# Patient Record
Sex: Male | Born: 1980 | Race: Black or African American | Hispanic: No | Marital: Married | Smoking: Never smoker
Health system: Southern US, Community
[De-identification: ages and names within clinical notes are randomized; demographics above are authoritative.]

## PROBLEM LIST (undated history)

## (undated) HISTORY — PX: NO PAST SURGERIES: SHX2092

---

## 2015-03-19 ENCOUNTER — Emergency Department (INDEPENDENT_AMBULATORY_CARE_PROVIDER_SITE_OTHER)
Admission: EM | Admit: 2015-03-19 | Discharge: 2015-03-19 | Disposition: A | Discharge status: Home / Self Care | Payer: BLUE CROSS/BLUE SHIELD | Source: Home / Self Care | Attending: Family Medicine | Admitting: Family Medicine

## 2015-03-19 ENCOUNTER — Encounter (HOSPITAL_COMMUNITY): Payer: Self-pay | Admitting: Emergency Medicine

## 2015-03-19 ENCOUNTER — Emergency Department (INDEPENDENT_AMBULATORY_CARE_PROVIDER_SITE_OTHER): Payer: BLUE CROSS/BLUE SHIELD

## 2015-03-19 DIAGNOSIS — J189 Pneumonia, unspecified organism: Secondary | ICD-10-CM | POA: Diagnosis not present

## 2015-03-19 MED ORDER — CEFDINIR 300 MG PO CAPS: 300.0000 mg | ORAL_CAPSULE | Freq: Two times a day (BID) | ORAL | Status: DC

## 2015-03-19 MED ORDER — AZITHROMYCIN 250 MG PO TABS: 250.0000 mg | ORAL_TABLET | Freq: Every day | ORAL | Status: DC

## 2015-03-19 NOTE — Discharge Instructions (Signed)
Thank you for coming in today. °Call or go to the emergency room if you get worse, have trouble breathing, have chest pains, or palpitations.  ° °Pneumonia °Pneumonia is an infection of the lungs.  °CAUSES °Pneumonia may be caused by bacteria or a virus. Usually, these infections are caused by breathing infectious particles into the lungs (respiratory tract). °SIGNS AND SYMPTOMS  °· Cough. °· Fever. °· Chest pain. °· Increased rate of breathing. °· Wheezing. °· Mucus production. °DIAGNOSIS  °If you have the common symptoms of pneumonia, your health care provider will typically confirm the diagnosis with a chest X-ray. The X-ray will show an abnormality in the lung (pulmonary infiltrate) if you have pneumonia. Other tests of your blood, urine, or sputum may be done to find the specific cause of your pneumonia. Your health care provider may also do tests (blood gases or pulse oximetry) to see how well your lungs are working. °TREATMENT  °Some forms of pneumonia may be spread to other people when you cough or sneeze. You may be asked to wear a mask before and during your exam. Pneumonia that is caused by bacteria is treated with antibiotic medicine. Pneumonia that is caused by the influenza virus may be treated with an antiviral medicine. Most other viral infections must run their course. These infections will not respond to antibiotics.  °HOME CARE INSTRUCTIONS  °· Cough suppressants may be used if you are losing too much rest. However, coughing protects you by clearing your lungs. You should avoid using cough suppressants if you can. °· Your health care provider may have prescribed medicine if he or she thinks your pneumonia is caused by bacteria or influenza. Finish your medicine even if you start to feel better. °· Your health care provider may also prescribe an expectorant. This loosens the mucus to be coughed up. °· Take medicines only as directed by your health care provider. °· Do not smoke. Smoking is a common  cause of bronchitis and can contribute to pneumonia. If you are a smoker and continue to smoke, your cough may last several weeks after your pneumonia has cleared. °· A cold steam vaporizer or humidifier in your room or home may help loosen mucus. °· Coughing is often worse at night. Sleeping in a semi-upright position in a recliner or using a couple pillows under your head will help with this. °· Get rest as you feel it is needed. Your body will usually let you know when you need to rest. °PREVENTION °A pneumococcal shot (vaccine) is available to prevent a common bacterial cause of pneumonia. This is usually suggested for: °· People over 65 years old. °· Patients on chemotherapy. °· People with chronic lung problems, such as bronchitis or emphysema. °· People with immune system problems. °If you are over 65 or have a high risk condition, you may receive the pneumococcal vaccine if you have not received it before. In some countries, a routine influenza vaccine is also recommended. This vaccine can help prevent some cases of pneumonia. You may be offered the influenza vaccine as part of your care. °If you smoke, it is time to quit. You may receive instructions on how to stop smoking. Your health care provider can provide medicines and counseling to help you quit. °SEEK MEDICAL CARE IF: °You have a fever. °SEEK IMMEDIATE MEDICAL CARE IF:  °· Your illness becomes worse. This is especially true if you are elderly or weakened from any other disease. °· You cannot control your cough with suppressants and   are losing sleep. °· You begin coughing up blood. °· You develop pain which is getting worse or is uncontrolled with medicines. °· Any of the symptoms which initially brought you in for treatment are getting worse rather than better. °· You develop shortness of breath or chest pain. °MAKE SURE YOU:  °· Understand these instructions. °· Will watch your condition. °· Will get help right away if you are not doing well or get  worse. °Document Released: 12/04/2005 Document Revised: 04/20/2014 Document Reviewed: 02/23/2011 °Exit[employer]® Patient Information ©2015 [employer]. This information is not intended to replace advice given to you by your health care provider. Make sure you discuss any questions you have with your health care provider. ° °

## 2015-03-19 NOTE — ED Provider Notes (Signed)
Cam Haibraham Teeweh Honse III is a 34 y.o. male who presents to Urgent Care today for left lateral chest pain with deep inspiration. Symptoms present for one week. Patient notes bodyaches fevers and fatigue. Pain is located in the lateral aspect of his chest wall and is up to his shoulder blade. No pain with exertion. No vomiting or diarrhea.   History reviewed. No pertinent past medical history. History reviewed. No pertinent past surgical history. History  Substance Use Topics  . Smoking status: Never Smoker   . Smokeless tobacco: Not on file  . Alcohol Use: No   ROS as above Medications: No current facility-administered medications for this encounter.   Current Outpatient Prescriptions  Medication Sig Dispense Refill  . azithromycin (ZITHROMAX) 250 MG tablet Take 1 tablet (250 mg total) by mouth daily. Take first 2 tablets together, then 1 every day until finished. 6 tablet 0  . cefdinir (OMNICEF) 300 MG capsule Take 1 capsule (300 mg total) by mouth 2 (two) times daily. 14 capsule 0   No Known Allergies   Exam:  BP 141/87 mmHg  Pulse 118  Temp(Src) 102.7 F (39.3 C) (Oral)  Resp 18  SpO2 95% Gen: Well NAD nontoxic appearing HEENT: EOMI,  MMM Lungs: Normal work of breathing. Coarse breath sounds lateral left lung Chest wall: Nontender Heart: Tachycardia but regular no MRG Abd: NABS, Soft. Nondistended, Nontender Exts: Brisk capillary refill, warm and well perfused.   No results found for this or any previous visit (from the past 24 hour(s)). Dg Chest 2 View  03/19/2015   CLINICAL DATA:  Chest pain x1 week  EXAM: CHEST  2 VIEW  COMPARISON:  None.  FINDINGS: Left basilar opacity, atelectasis versus pneumonia.  Right lung is clear.  No pleural effusion or pneumothorax.  The heart is normal in size.  Visualized osseous structures are within normal limits.  IMPRESSION: Left basilar opacity, atelectasis versus pneumonia.   Electronically Signed   By: Charline BillsSriyesh  Krishnan M.D.   On:  03/19/2015 20:38    Assessment and Plan: 34 y.o. male with community-acquired pneumonia. Patient is well appearing. Treat with Omnicef and azithromycin. Prompt follow-up of worsening. Return as needed.  Discussed warning signs or symptoms. Please see discharge instructions. Patient expresses understanding.     Rodolph BongEvan S Duayne Brideau, MD 03/19/15 2120

## 2015-03-19 NOTE — ED Notes (Signed)
C/o cold sx onset 1 week Sx include body aches, fevers, fatigue, back pain when he breathes  Also reports constipation x1 week Last had aleve today around 0600 Alert, no signs of acute distress.

## 2015-04-01 ENCOUNTER — Ambulatory Visit
Admission: RE | Admit: 2015-04-01 | Discharge: 2015-04-01 | Disposition: A | Discharge status: Home / Self Care | Payer: BLUE CROSS/BLUE SHIELD | Source: Ambulatory Visit | Attending: Family Medicine | Admitting: Family Medicine

## 2015-04-01 ENCOUNTER — Other Ambulatory Visit: Payer: Self-pay | Admitting: Family Medicine

## 2015-04-01 DIAGNOSIS — J189 Pneumonia, unspecified organism: Secondary | ICD-10-CM

## 2015-04-23 ENCOUNTER — Ambulatory Visit
Admission: RE | Admit: 2015-04-23 | Discharge: 2015-04-23 | Disposition: A | Discharge status: Home / Self Care | Payer: BLUE CROSS/BLUE SHIELD | Source: Ambulatory Visit | Attending: Family Medicine | Admitting: Family Medicine

## 2015-04-23 ENCOUNTER — Other Ambulatory Visit: Payer: Self-pay | Admitting: Family Medicine

## 2015-04-23 DIAGNOSIS — R0602 Shortness of breath: Secondary | ICD-10-CM

## 2015-04-28 ENCOUNTER — Other Ambulatory Visit: Payer: Self-pay | Admitting: Family Medicine

## 2015-04-28 DIAGNOSIS — R918 Other nonspecific abnormal finding of lung field: Secondary | ICD-10-CM

## 2015-04-30 ENCOUNTER — Ambulatory Visit
Admission: RE | Admit: 2015-04-30 | Discharge: 2015-04-30 | Disposition: A | Discharge status: Home / Self Care | Payer: BLUE CROSS/BLUE SHIELD | Source: Ambulatory Visit | Attending: Family Medicine | Admitting: Family Medicine

## 2015-04-30 DIAGNOSIS — R918 Other nonspecific abnormal finding of lung field: Secondary | ICD-10-CM

## 2015-05-11 ENCOUNTER — Other Ambulatory Visit: Payer: Self-pay | Admitting: Family Medicine

## 2015-05-11 DIAGNOSIS — R591 Generalized enlarged lymph nodes: Secondary | ICD-10-CM

## 2015-05-14 ENCOUNTER — Ambulatory Visit
Admission: RE | Admit: 2015-05-14 | Discharge: 2015-05-14 | Disposition: A | Discharge status: Home / Self Care | Payer: BLUE CROSS/BLUE SHIELD | Source: Ambulatory Visit | Attending: Family Medicine | Admitting: Family Medicine

## 2015-05-14 DIAGNOSIS — R591 Generalized enlarged lymph nodes: Secondary | ICD-10-CM

## 2015-05-14 MED ORDER — IOPAMIDOL (ISOVUE-300) INJECTION 61%
125.0000 mL | Freq: Once | INTRAVENOUS | Status: AC | PRN
  Administered 2015-05-14: 125 mL via INTRAVENOUS

## 2015-05-20 ENCOUNTER — Other Ambulatory Visit (HOSPITAL_COMMUNITY): Payer: Self-pay | Admitting: Family Medicine

## 2015-05-20 ENCOUNTER — Ambulatory Visit (HOSPITAL_COMMUNITY)
Admission: RE | Admit: 2015-05-20 | Discharge: 2015-05-20 | Disposition: A | Discharge status: Home / Self Care | Payer: BLUE CROSS/BLUE SHIELD | Source: Ambulatory Visit | Attending: Cardiovascular Disease | Admitting: Cardiovascular Disease

## 2015-05-20 ENCOUNTER — Other Ambulatory Visit: Payer: Self-pay | Admitting: Family Medicine

## 2015-05-20 DIAGNOSIS — I340 Nonrheumatic mitral (valve) insufficiency: Secondary | ICD-10-CM | POA: Diagnosis not present

## 2015-05-20 DIAGNOSIS — R0602 Shortness of breath: Secondary | ICD-10-CM

## 2015-05-20 DIAGNOSIS — R0600 Dyspnea, unspecified: Secondary | ICD-10-CM

## 2015-05-20 DIAGNOSIS — I517 Cardiomegaly: Secondary | ICD-10-CM

## 2015-05-20 DIAGNOSIS — R0609 Other forms of dyspnea: Secondary | ICD-10-CM

## 2015-05-21 ENCOUNTER — Ambulatory Visit (INDEPENDENT_AMBULATORY_CARE_PROVIDER_SITE_OTHER): Payer: BLUE CROSS/BLUE SHIELD | Admitting: Internal Medicine

## 2015-05-21 ENCOUNTER — Encounter (INDEPENDENT_AMBULATORY_CARE_PROVIDER_SITE_OTHER): Payer: Self-pay

## 2015-05-21 DIAGNOSIS — R0600 Dyspnea, unspecified: Secondary | ICD-10-CM

## 2015-05-21 LAB — PULMONARY FUNCTION TEST
DL/VA % pred: 98 %
DL/VA: 4.84 ml/min/mmHg/L
DLCO UNC: 17.91 ml/min/mmHg
DLCO unc % pred: 47 %
FEF 25-75 Post: 1.91 L/sec
FEF 25-75 Pre: 1.45 L/sec
FEF2575-%Change-Post: 31 %
FEF2575-%PRED-POST: 44 %
FEF2575-%PRED-PRE: 33 %
FEV1-%Change-Post: 10 %
FEV1-%PRED-POST: 45 %
FEV1-%PRED-PRE: 41 %
FEV1-Post: 1.91 L
FEV1-Pre: 1.73 L
FEV1FVC-%CHANGE-POST: 3 %
FEV1FVC-%Pred-Pre: 92 %
FEV6-%Change-Post: 8 %
FEV6-%Pred-Post: 48 %
FEV6-%Pred-Pre: 44 %
FEV6-Post: 2.41 L
FEV6-Pre: 2.22 L
FEV6FVC-%PRED-POST: 102 %
FEV6FVC-%Pred-Pre: 102 %
FVC-%Change-Post: 7 %
FVC-%PRED-POST: 47 %
FVC-%Pred-Pre: 44 %
FVC-POST: 2.41 L
FVC-Pre: 2.24 L
PRE FEV1/FVC RATIO: 77 %
Post FEV1/FVC ratio: 79 %
Post FEV6/FVC ratio: 100 %
Pre FEV6/FVC Ratio: 100 %
RV % pred: 70 %
RV: 1.38 L
TLC % pred: 48 %
TLC: 3.73 L

## 2015-05-21 NOTE — Progress Notes (Signed)
PFT done today. 

## 2015-05-25 ENCOUNTER — Institutional Professional Consult (permissible substitution): Payer: BLUE CROSS/BLUE SHIELD | Admitting: Emergency Medicine

## 2015-06-02 ENCOUNTER — Ambulatory Visit (INDEPENDENT_AMBULATORY_CARE_PROVIDER_SITE_OTHER): Payer: Self-pay | Admitting: Pulmonary Disease

## 2015-06-02 ENCOUNTER — Encounter: Payer: Self-pay | Admitting: Pulmonary Disease

## 2015-06-02 VITALS — BP 124/92 | HR 84 | Ht 74.0 in | Wt 253.0 lb

## 2015-06-02 DIAGNOSIS — D869 Sarcoidosis, unspecified: Secondary | ICD-10-CM

## 2015-06-02 NOTE — Patient Instructions (Signed)
Will get medical records from Dr. Ines Bloomer office Follow up in 4 to 6 weeks

## 2015-06-02 NOTE — Progress Notes (Deleted)
   Subjective:    Patient ID: David Bowen, male    DOB: 26-Nov-1981, 34 y.o.   MRN: [id number]  HPI    Review of Systems  Constitutional: Negative for fever and unexpected weight change.  HENT: Positive for congestion. Negative for dental problem, ear pain, nosebleeds, postnasal drip, rhinorrhea, sinus pressure, sneezing, sore throat and trouble swallowing.   Eyes: Positive for pain. Negative for redness and itching.  Respiratory: Positive for shortness of breath. Negative for cough, chest tightness and wheezing.   Cardiovascular: Positive for chest pain ( sharp at times//pressure/restricted). Negative for palpitations and leg swelling.  Gastrointestinal: Negative for nausea and vomiting.  Genitourinary: Negative for dysuria.  Musculoskeletal: Positive for joint swelling and arthralgias.  Skin: Negative for rash.  Neurological: Negative for headaches.  Hematological: Does not bruise/bleed easily.  Psychiatric/Behavioral: Negative for dysphoric mood. The patient is not nervous/anxious.        Objective:   Physical Exam        Assessment & Plan:

## 2015-06-02 NOTE — Progress Notes (Signed)
Chief Complaint  Patient presents with  . PULMONARY CONSULT    Referred by Dr Shirlean Mylar - Pulm HTN and Sarcoid. Recent CT and PFT.     History of Present Illness: David Bowen is a 34 y.o. male for evaluation of sarcoidosis.  He was doing well until April.  He was told he had pneumonia at urgent care, and treated with antibiotics.  This didn't help.  He then was seen by his PCP and given different antibiotic.  He felt better, but still had dyspnea and chest pain.  As a result he had CT chest and then abdomen/pelvis.  He was referred to Dr. Kellie Simmering with rheumatology.  He says he had lab work, and was told he had sarcoidosis.  He was started on prednisone earlier this week.  He is feeling much better.  His CT chest/abd/pelvis showed lymphadenopathy.  He has not had any biopsy of these yet.  His PFT showed restrictive and diffusion defect.  Echo showed mild elevation in pulmonary artery pressures.  He denies cough, sputum, fever, rash, or swelling.  He lost about 25 lbs in April.  His weight has been steady since then.  He denies hx of smoking.  There is no travel hx.  He has a Development worker, international aid.  He works as a Veterinary surgeon.  He is from [place], but has lived in [place] for the past 11 years.  He was never in the [employer].   Tests: CT chest 04/30/15 >> LAN CT abd/pelvis 05/14/15 >> lower thoracic and pelvic LAN Echo 05/20/15 >> mild LVH, EF 55 to 60%, grade 1 diastolic dysfx, mild MR, PAS 32 mmHg PFT 05/21/15 >> FEV1 1.91 (45%), FEV1% 79, TLC 3.73 (48%), DLCO 47%, no BD  David Bowen  has no past medical history on file.  David Bowen  has past surgical history that includes No past surgeries.  Prior to Admission medications   Medication Sig Start Date End Date Taking? Authorizing Provider  Calcium Carb-Cholecalciferol (CALCIUM-VITAMIN D) 600-400 MG-UNIT TABS Take 1 capsule by mouth daily.   Yes Historical Provider, MD  predniSONE (DELTASONE) 20 MG tablet  Take 60 mg by mouth daily with breakfast.   Yes Historical Provider, MD    No Known Allergies  His family history includes Allergies in his mother and sister; Cancer in his sister.  He  reports that he has never smoked. He does not have any smokeless tobacco history on file. He reports that he does not drink alcohol or use illicit drugs.  Review of Systems  Constitutional: Negative for fever and unexpected weight change.  HENT: Positive for congestion. Negative for dental problem, ear pain, nosebleeds, postnasal drip, rhinorrhea, sinus pressure, sneezing, sore throat and trouble swallowing.   Eyes: Positive for pain. Negative for redness and itching.  Respiratory: Positive for shortness of breath. Negative for cough, chest tightness and wheezing.   Cardiovascular: Positive for chest pain ( sharp at times//pressure/restricted). Negative for palpitations and leg swelling.  Gastrointestinal: Negative for nausea and vomiting.  Genitourinary: Negative for dysuria.  Musculoskeletal: Positive for joint swelling and arthralgias.  Skin: Negative for rash.  Neurological: Negative for headaches.  Hematological: Does not bruise/bleed easily.  Psychiatric/Behavioral: Negative for dysphoric mood. The patient is not nervous/anxious.    Physical Exam: Blood pressure 124/92, pulse 84, height 6\' 2"  (1.88 m), weight 253 lb (114.76 kg), SpO2 98 %. Body mass index is 32.47 kg/(m^2).  General - No distress ENT - No sinus tenderness,  no oral exudate, no LAN, no thyromegaly, TM clear, pupils equal/reactive Cardiac - s1s2 regular, no murmur, pulses symmetric Chest - No wheeze/rales/dullness, good air entry, normal respiratory excursion Back - No focal tenderness Abd - Soft, non-tender, no organomegaly, + bowel sounds Ext - No edema Neuro - Normal strength, cranial nerves intact Skin - No rashes Psych - Normal mood, and behavior  Discussion: He had acute respiratory illness in April.  His CT imaging  showed lymphadenopathy.  His PFT showed restrictive and diffusion defects.  He had recent evaluation by rheumatology, and was felt to have sarcoidosis.  He reports symptomatic improvement since being started on prednisone.  He has not had tissue confirmation of sarcoidosis.  Assessment/Plan:  Probable sarcoidosis. Plan: - continue prednisone as per rheumatology - will get copy of lab tests from rheumatology - will need f/u imaging studies in few weeks >> if no improvement, then will need tissue sampling   Coralyn Helling, MD [facility] Pager:  [phone]

## 2015-06-10 ENCOUNTER — Telehealth: Payer: Self-pay | Admitting: Pulmonary Disease

## 2015-06-10 NOTE — Telephone Encounter (Signed)
Received records from Dr. Kellie Simmering forwarded to Dr. Craige Cotta 06/10/15 fbg.

## 2015-06-24 ENCOUNTER — Encounter: Payer: Self-pay | Admitting: Pulmonary Disease

## 2015-06-24 DIAGNOSIS — J849 Interstitial pulmonary disease, unspecified: Secondary | ICD-10-CM | POA: Insufficient documentation

## 2015-07-18 IMAGING — CT CT CHEST W/O CM
3 of 4 series · 17 of 30 positions shown, 19 images · non-contrast
Comparison: Chest x-ray 04/23/2015 and 03/19/2015

CLINICAL DATA: Followup pneumonia and infiltrates. 25 lb weight
loss since [place]. No history of cancer.

EXAM:
CT CHEST WITHOUT CONTRAST
TECHNIQUE: Multidetector CT imaging of the chest was performed following the
standard protocol without IV contrast.

[Series 3: chest w/o · axial · non-contrast · 0.70mm/px · z∈[-214,-14]mm · 5 of 61 slices shown, 7 images]
[im 11/61  mediastinal]
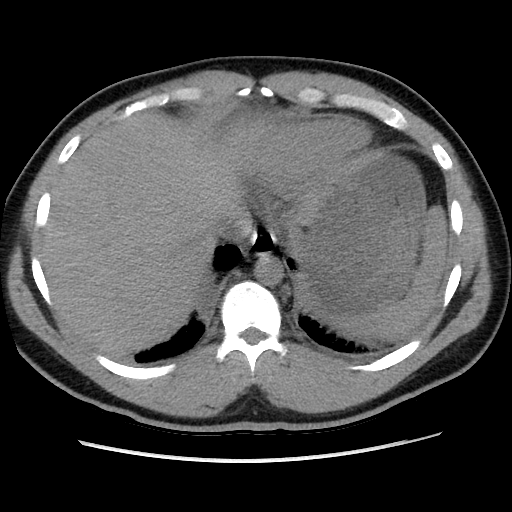
[im 11/61  lung]
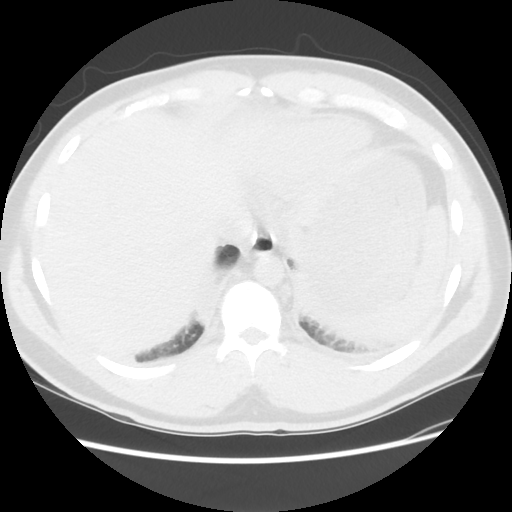
[im 21/61  lung]
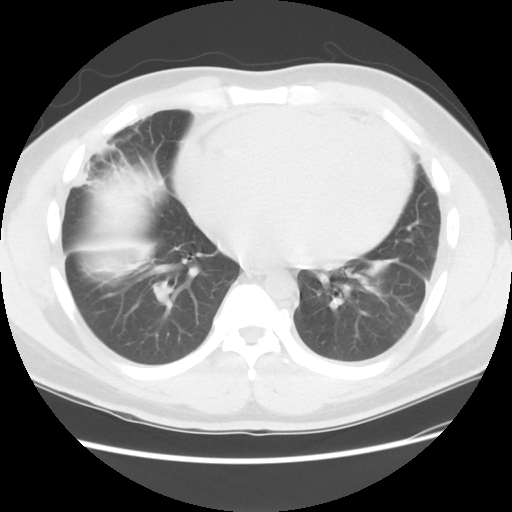
[im 31/61  lung]
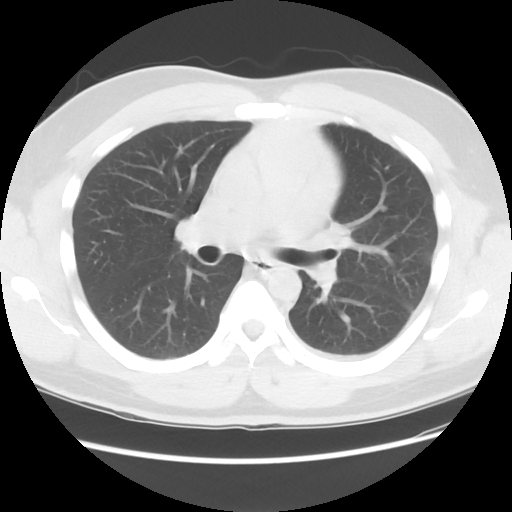
[im 41/61  lung]
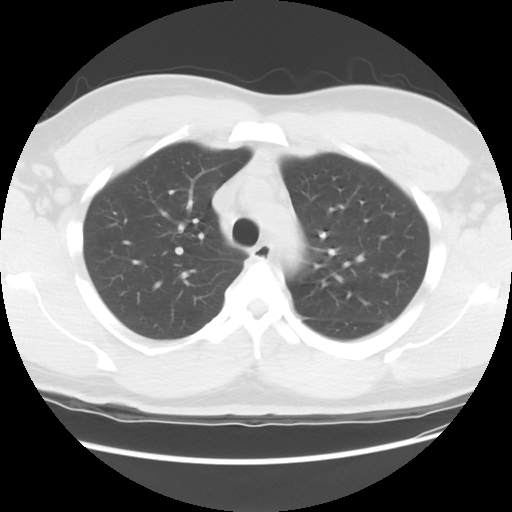
[im 51/61  mediastinal]
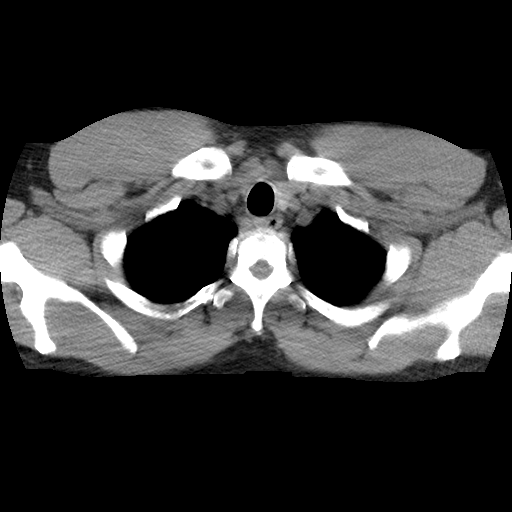
[im 51/61  lung]
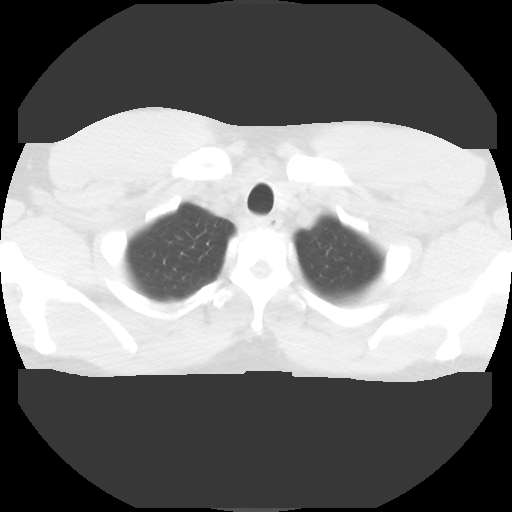

[Series 4: lung windows · axial · 0.70mm/px · z∈[-204,-24]mm · 4 of 61 slices shown]
[im 13/61  lung]
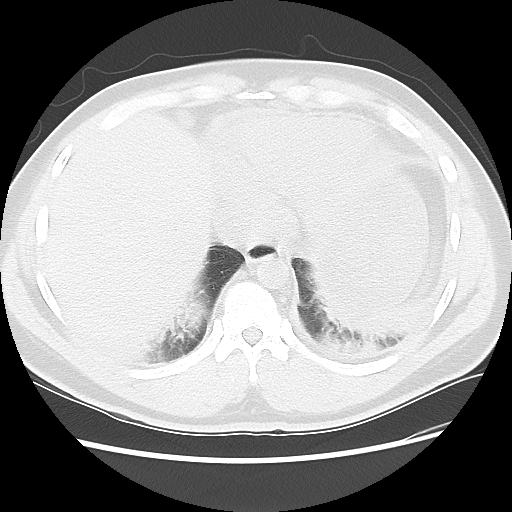
[im 25/61  lung]
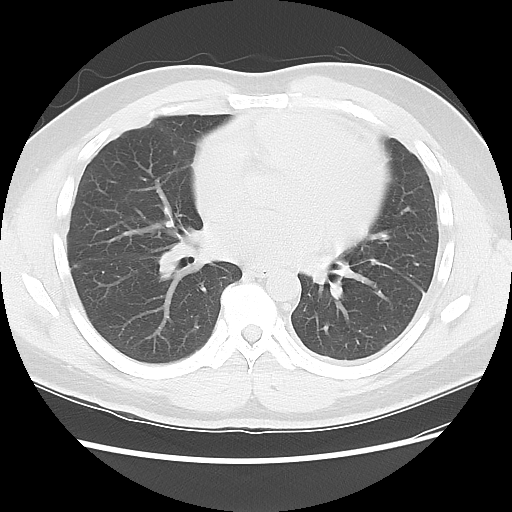
[im 37/61  lung]
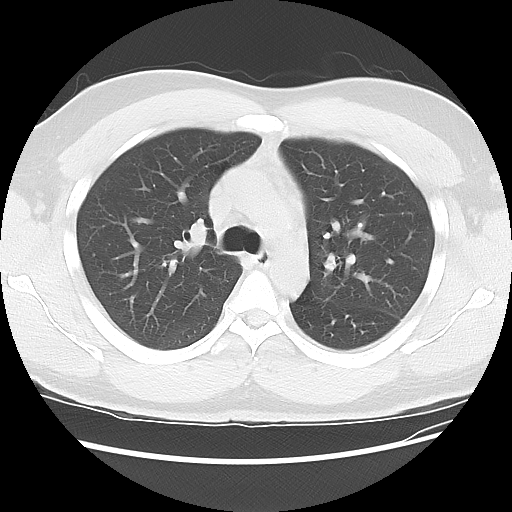
[im 49/61  lung]
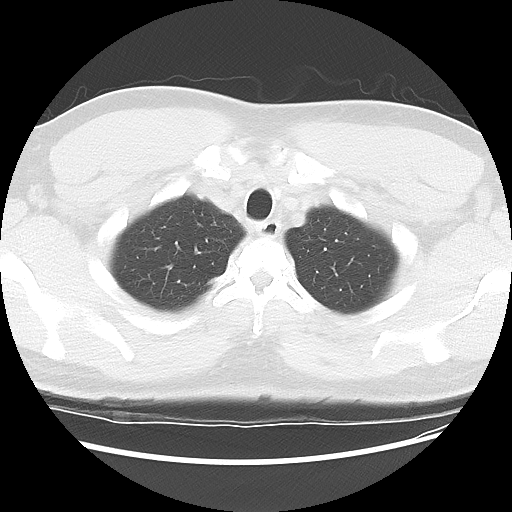

[Series 602: sagittal body · sagittal · 0.70mm/px · 8 of 145 slices shown]
[im 11/145  mediastinal]
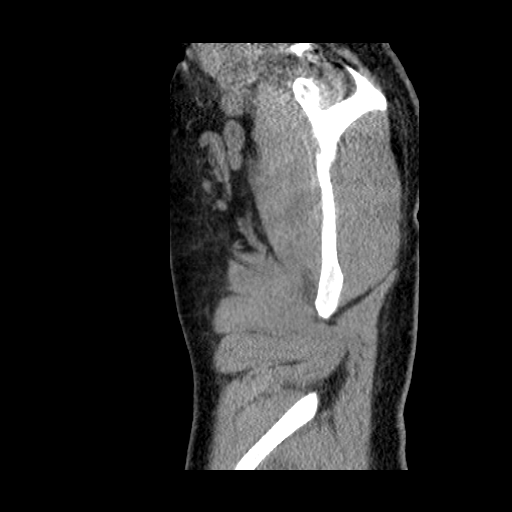
[im 31/145  mediastinal]
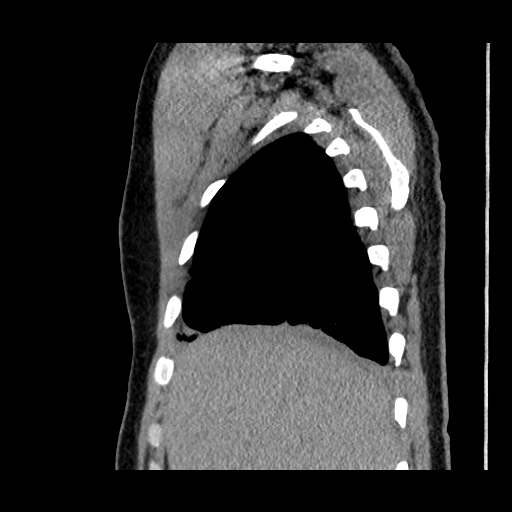
[im 52/145  mediastinal]
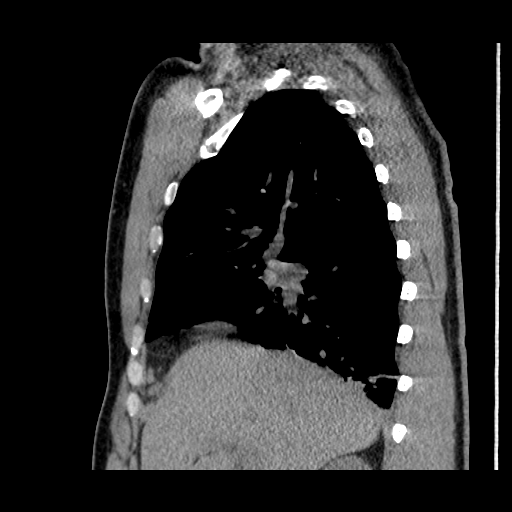
[im 62/145  mediastinal]
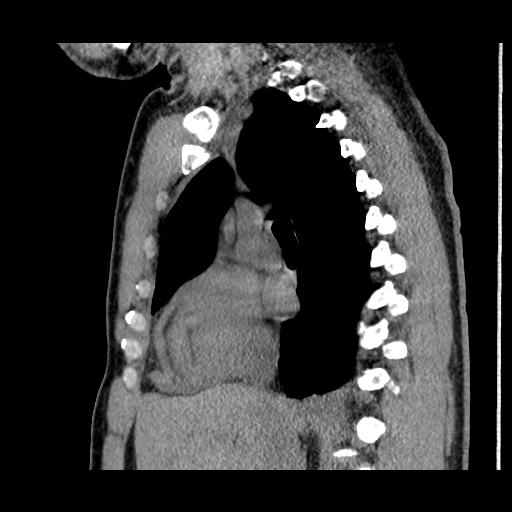
[im 83/145  mediastinal]
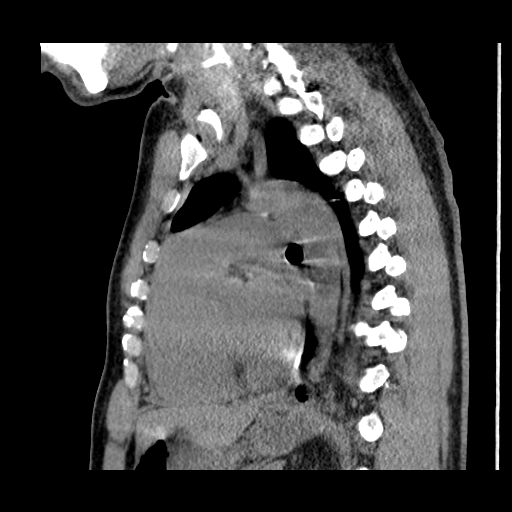
[im 93/145  mediastinal]
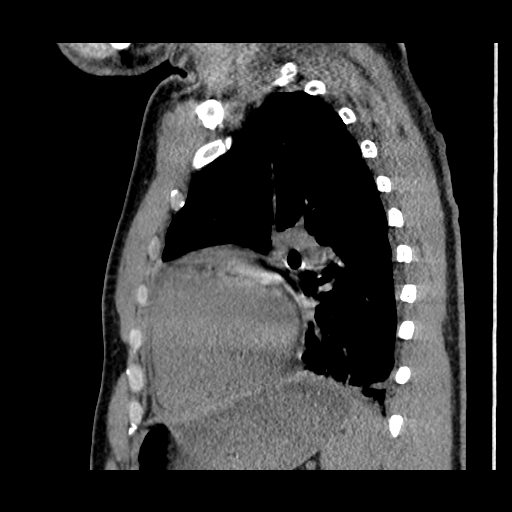
[im 114/145  mediastinal]
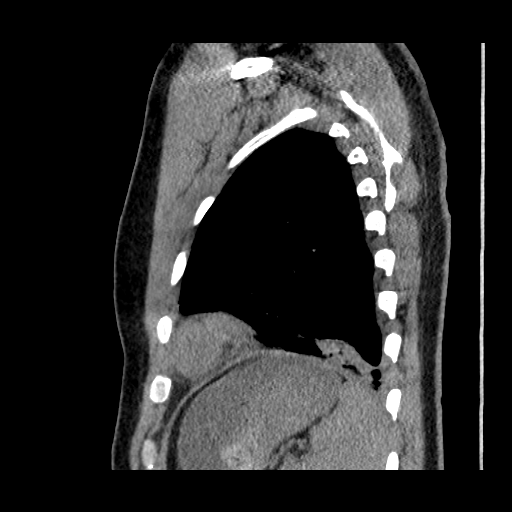
[im 134/145  mediastinal]
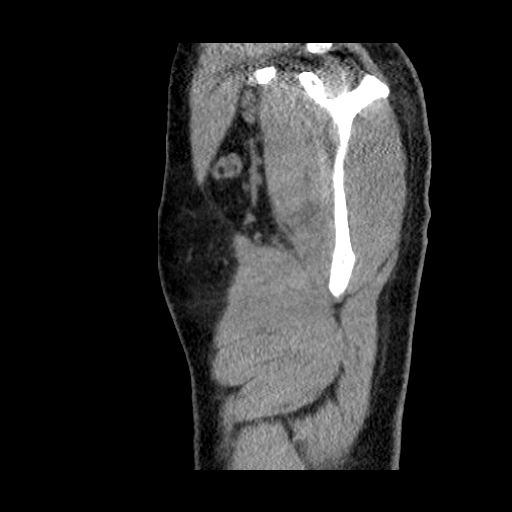

[17 of 30 positions shown; findings below may reference images not displayed]

FINDINGS: Lungs are adequately inflated and demonstrate subtle bibasilar
opacification which may be due to atelectasis or infection. No
significant effusion. 3-4 mm nodule adjacent the minor fissure.
Airways are within normal.

There is mild cardiomegaly with tiny amount of pericardial fluid.
There is a 1 cm precarinal lymph node. No definite hilar adenopathy.
There is a few small retrocrural lymph nodes at the level of the
diaphragm with the largest measuring 1 cm by short axis. Bilateral
pericardiophrenic lymph nodes are present with a 1.1 cm right
pericardiophrenic lymph node. Remaining mediastinal structures are
unremarkable. Mild bilateral axillary adenopathy is present.

Images through the upper abdomen demonstrate no focal abnormality.
IMPRESSION: Bilateral axillary adenopathy with minimally prominent precarinal
lymph node as well as small pericardiophrenic lymph nodes and
retrocrural lymph nodes at the level of the diaphragm. Cannot
exclude a systemic infectious/inflammatory or neoplastic process.
Recommend further evaluation with CT of the abdomen pelvis.

Mild heterogeneous bibasilar opacification suggesting atelectasis
although cannot exclude infection. 3-4 mm nodule adjacent the minor
fissure.

Mild cardiomegaly with small amount of pericardial fluid.

## 2015-07-19 ENCOUNTER — Ambulatory Visit: Payer: Self-pay | Admitting: Pulmonary Disease

## 2019-12-05 ENCOUNTER — Other Ambulatory Visit: Payer: Self-pay

## 2019-12-05 DIAGNOSIS — Z20822 Contact with and (suspected) exposure to covid-19: Secondary | ICD-10-CM

## 2019-12-06 LAB — NOVEL CORONAVIRUS, NAA: SARS-CoV-2, NAA: NOT DETECTED
